# Patient Record
Sex: Female | Born: 1965
Health system: Southern US, Community
[De-identification: ages and names within clinical notes are randomized; demographics above are authoritative.]

## PROBLEM LIST (undated history)

## (undated) HISTORY — PX: SPLENECTOMY, PARTIAL: SHX787

---

## 2001-07-16 ENCOUNTER — Other Ambulatory Visit: Admission: RE | Admit: 2001-07-16 | Discharge: 2001-07-16 | Payer: Self-pay | Admitting: Gynecology

## 2005-04-14 HISTORY — PX: APPENDECTOMY: SHX54

## 2005-11-04 ENCOUNTER — Observation Stay (HOSPITAL_COMMUNITY): Admission: EM | Admit: 2005-11-04 | Discharge: 2005-11-05 | Payer: Self-pay | Admitting: Emergency Medicine

## 2005-11-21 ENCOUNTER — Inpatient Hospital Stay (HOSPITAL_COMMUNITY): Admission: EM | Admit: 2005-11-21 | Discharge: 2005-12-04 | Payer: Self-pay | Admitting: Emergency Medicine

## 2005-12-19 ENCOUNTER — Encounter: Admission: RE | Admit: 2005-12-19 | Discharge: 2005-12-19 | Payer: Self-pay | Admitting: Cardiothoracic Surgery

## 2008-04-06 IMAGING — CT CT ABCESS DRAINAGE
1 of 2 series · 9 of 12 positions shown, 15 images · non-contrast
Comparison: CT scan 11/21/05.

CLINICAL DATA: 39-year-old female, status post appendectomy with a right lower quadrant retroperitoneal abscess. 
RIGHT LOWER QUADRANT RETROPERITONEAL ABSCESS DRAINAGE:

[Series 4: carevisionct · axial · 1.48mm/px · z∈[-187,-155]mm · 9 of 11 slices shown, 15 images]
[im 2/11  soft-tissue]
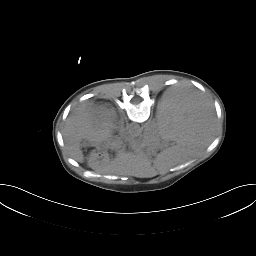
[im 2/11  bone]
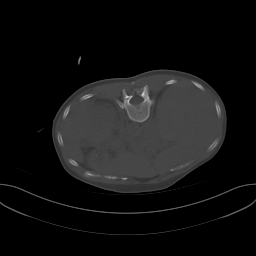
[im 3/11  soft-tissue]
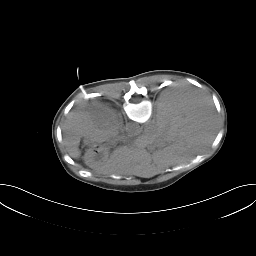
[im 4/11  soft-tissue]
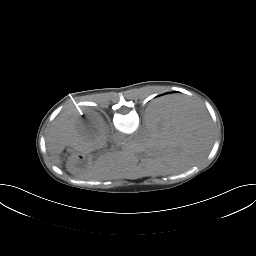
[im 5/11  soft-tissue]
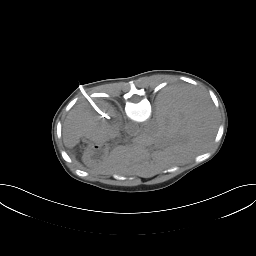
[im 6/11  soft-tissue]
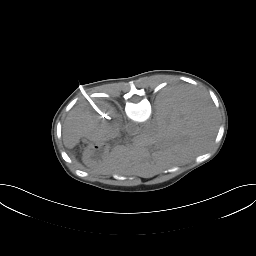
[im 7/11  soft-tissue]
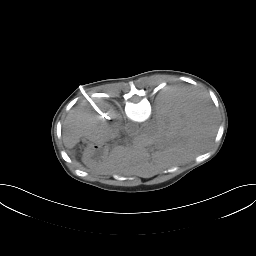
[im 7/11  lung]
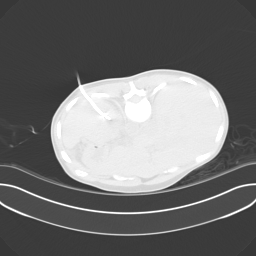
[im 8/11  soft-tissue]
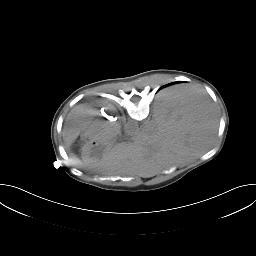
[im 8/11  lung]
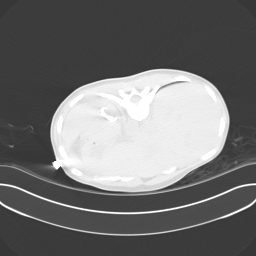
[im 9/11  soft-tissue]
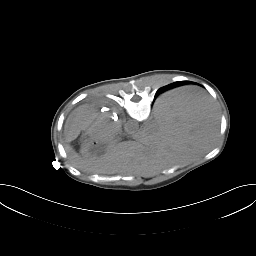
[im 9/11  lung]
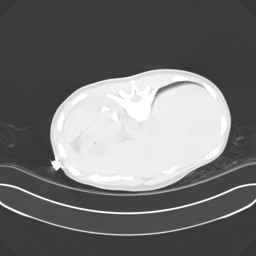
[im 10/11  soft-tissue]
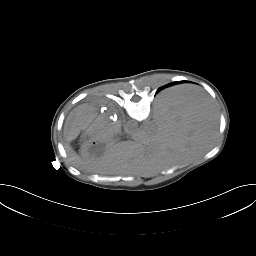
[im 10/11  lung]
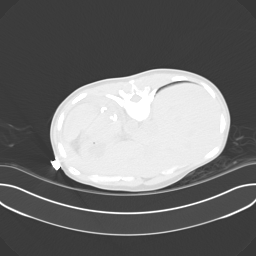
[im 10/11  bone]
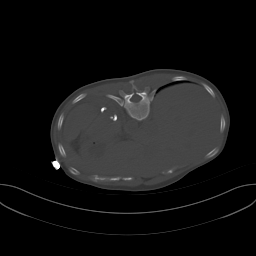

[9 of 12 positions shown; findings below may reference images not displayed]

Radiologist:  Nba Maiden, M.D.
Guidance:  Noncontrast CT.
Medications:  5 mg Versed, 150 mcg fentanyl. 
Conscious Sedation Time:  75 minutes for both procedures.
Procedure/Findings:  Informed consent was obtained.  The patient was positioned supine.  Noncontrast localization imaging was performed through the lower abdomen to demonstrate the right lower quadrant small abscess.  Under sterile conditions and local anesthesia, an 18 gauge introducer needle was advanced into the right lower quadrant abscess.  There was return of thick bloody purulent material.  An Amplatz guidewire was inserted followed by dilatation to insert a 10 French pigtail catheter.  Additional aspiration yielded a total volume of 6 cc of thick purulent material from the right lower quadrant.  The catheter was secured with a 0 Prolene suture and connected to a bulb suction drainage device.  A dry sterile dressing was applied.  The patient tolerated the procedure well.
IMPRESSION: 1.  Right lower quadrant retroperitoneal abscess drainage procedure of a small postoperative abscess related to recent appendicitis.
2.  6 cc of purulent material aspirated and sent for gram stain and culture.
CT-GUIDED DRAINAGE OF A LEFT UPPER QUADRANT SUBPHRENIC AND SUBCAPSULAR SPLENIC ABSCESS:
Radiologist:  Nba Maiden, M.D.
Guidance:  Noncontrast CT.
Medications:  5 mg Versed, 150 mcg fentanyl. 
Conscious Sedation Time:  75 minutes for both procedures.
Procedure/Findings:  The patient was repositioned prone.  Noncontrast localization imaging was performed through the upper abdomen to visualize the left upper quadrant subphrenic abscess.  Under sterile conditions and local anesthesia, an 18 gauge introducer needle was advanced intercostal into the subphrenic/subcapsular splenic abscess.  There was return of purulent material.  An Amplatz guidewire was inserted followed by dilatation to insert a 10 French drain.  Drainage position was confirmed within the cavity with CT imaging.  44 cc of purulent material was immediately aspirated.  The catheter was secured with a 0 Prolene suture and connected to a bulb suction device in a similar fashion.  A dry sterile dressing was applied.   The patient tolerated the procedure well.  There were no immediate complications.
IMPRESSION: CT-guided drainage of a left upper quadrant subphrenic and subcapsular splenic abscess with initial removal of 44 cc of purulent fluid.

## 2008-04-09 IMAGING — CR DG CHEST 1V
1 series · 1 of 1 positions shown · non-contrast
Comparison: none

CLINICAL DATA: Status post left thoracentesis.
 PORTABLE CHEST ? 1 VIEW ? 11/24/05 ? 5796 HOURS:

[view not recorded]
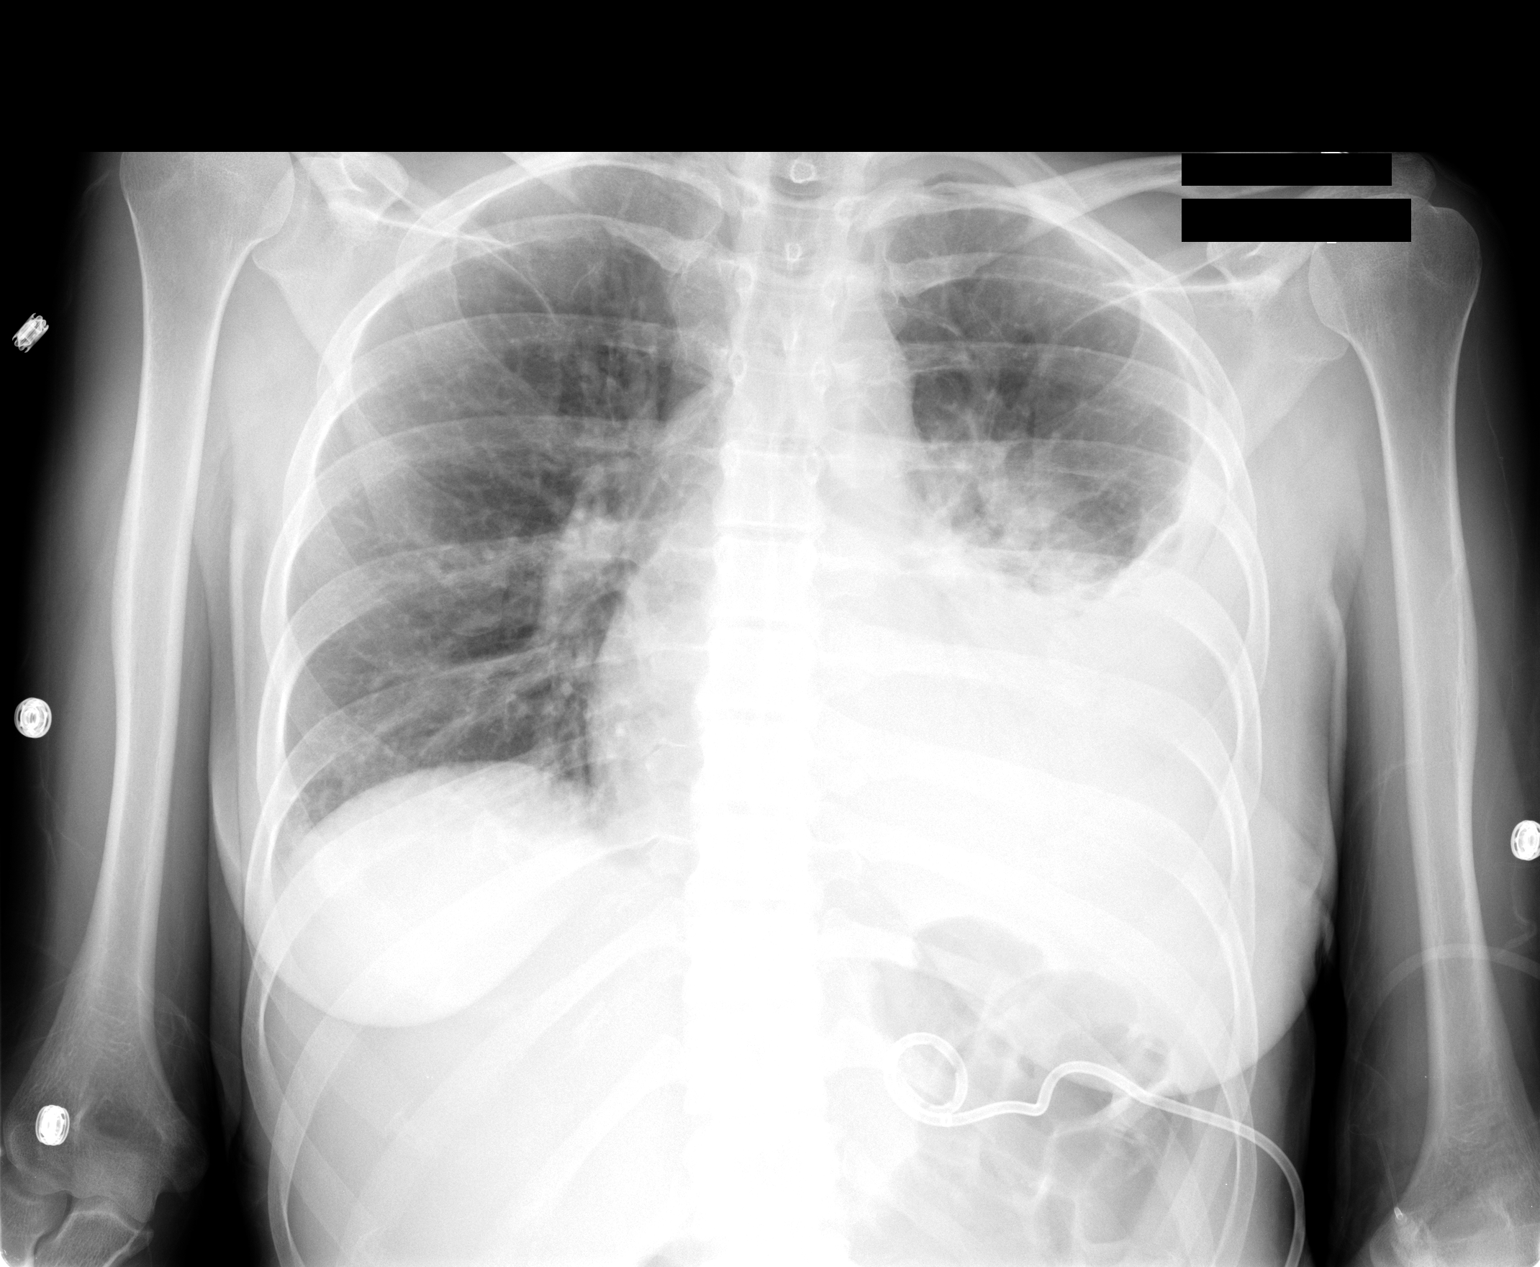

[1 of 1 positions shown; findings below may reference images not displayed]

FINDINGS: Left pleural fluid remains present as well as underlying consolidation and atelectasis of the left lower lung.  No pneumothorax after the procedure.  Drainage catheter present in the left upper abdomen.
IMPRESSION: Residual left pleural fluid with underlying atelectasis and consolidation of the left lower lung.  No pneumothorax after the procedure.

## 2010-05-05 ENCOUNTER — Encounter: Payer: Self-pay | Admitting: Cardiothoracic Surgery

## 2020-01-17 ENCOUNTER — Other Ambulatory Visit: Payer: Self-pay

## 2020-01-17 ENCOUNTER — Other Ambulatory Visit: Payer: Self-pay | Admitting: Nurse Practitioner

## 2020-01-17 ENCOUNTER — Encounter: Payer: Self-pay | Admitting: Nurse Practitioner

## 2020-01-17 ENCOUNTER — Ambulatory Visit (INDEPENDENT_AMBULATORY_CARE_PROVIDER_SITE_OTHER): Payer: BC Managed Care – PPO | Admitting: Nurse Practitioner

## 2020-01-17 VITALS — BP 122/80 | Ht 64.0 in | Wt 99.0 lb

## 2020-01-17 DIAGNOSIS — Z1231 Encounter for screening mammogram for malignant neoplasm of breast: Secondary | ICD-10-CM

## 2020-01-17 DIAGNOSIS — Z13220 Encounter for screening for lipoid disorders: Secondary | ICD-10-CM | POA: Diagnosis not present

## 2020-01-17 DIAGNOSIS — B009 Herpesviral infection, unspecified: Secondary | ICD-10-CM | POA: Diagnosis not present

## 2020-01-17 DIAGNOSIS — Z113 Encounter for screening for infections with a predominantly sexual mode of transmission: Secondary | ICD-10-CM

## 2020-01-17 DIAGNOSIS — Z01419 Encounter for gynecological examination (general) (routine) without abnormal findings: Secondary | ICD-10-CM

## 2020-01-17 DIAGNOSIS — Z23 Encounter for immunization: Secondary | ICD-10-CM | POA: Diagnosis not present

## 2020-01-17 DIAGNOSIS — R87612 Low grade squamous intraepithelial lesion on cytologic smear of cervix (LGSIL): Secondary | ICD-10-CM | POA: Diagnosis not present

## 2020-01-17 NOTE — Addendum Note (Signed)
Addended by: Tito Dine on: 01/17/2020 09:48 AM   Modules accepted: Orders

## 2020-01-17 NOTE — Progress Notes (Signed)
   Brittany Nixon [birth year] [id number]   History:  54 y.o. G2P2 presents as new patient to establish care. Normal pap history, last pap years ago per patient. History of irregular cycles, LMP in July. Denies menopausal symptoms but has had increased anxiety but may be due to personal stress.  She has also had some weight loss.  Has not had screening mammogram or colonoscopy. Would like STD testing today.  Gynecologic History Patient's last menstrual period was 11/05/2019. Period Pattern: (!) Irregular Menstrual Flow: Moderate Menstrual Control: Maxi pad, Tampon Dysmenorrhea: (!) Mild Dysmenorrhea Symptoms: Cramping Contraception: none Last Pap: UNK. Results were: normal Last mammogram: Never Last colonoscopy: Never  Past medical history, past surgical history, family history and social history were all reviewed and documented in the EPIC chart.  ROS:  A ROS was performed and pertinent positives and negatives are included.  Exam:  Vitals:   01/17/20 0832  BP: 122/80  Weight: 99 lb (44.9 kg)  Height: 5\' 4"  (1.626 m)   Body mass index is 16.99 kg/m.  General appearance:  Normal, underweight Thyroid:  Symmetrical, normal in size, without palpable masses or nodularity. Respiratory  Auscultation:  Clear without wheezing or rhonchi Cardiovascular  Auscultation:  Regular rate, without rubs, murmurs or gallops  Edema/varicosities:  Not grossly evident Abdominal  Soft,nontender, without masses, guarding or rebound.  Liver/spleen:  No organomegaly noted  Hernia:  None appreciated  Skin  Inspection:  Grossly normal   Breasts: Examined lying and sitting.   Right: Without masses, retractions, discharge or axillary adenopathy.   Left: Without masses, retractions, discharge or axillary adenopathy. Gentitourinary   Inguinal/mons:  Normal without inguinal adenopathy  External genitalia:  Normal  BUS/Urethra/Skene's glands:  Normal  Vagina:  Normal  Cervix:  Normal  Uterus:   Anteverted, normal in size, shape and contour.  Midline and mobile  Adnexa/parametria:     Rt: Without masses or tenderness.   Lt: Without masses or tenderness.  Anus and perineum: Normal   Assessment/Plan:  54 y.o. G2P2 for annual exam.   Well female exam with routine gynecological exam - Plan: CBC with Differential/Platelet, Comprehensive metabolic panel, Lipid panel, TSH, VITAMIN D 25 Hydroxy (Vit-D Deficiency, Fractures). Education provided on SBEs, importance of preventative screenings, current guidelines, high calcium diet, regular exercise, and multivitamin daily.   Screen for STD (sexually transmitted disease) - Plan: HIV Antibody (routine testing w rflx), RPR, C. trachomatis/N. gonorrhoeae RNA  HSV-1 infection - Plan: HSV(herpes simplex vrs) 1+2 ab-IgG - Has had a couple of sores on mouth.   Need for immunization against influenza - Plan: Flu Vaccine QUAD 36+ mos IM  Screening for cervical cancer - normal pap history.  Last Pap many years ago.  Pap with high risk HPV typing today.  Screening for breast cancer -has not had screening mammogram.  Discussed current guidelines and importance of preventative screenings.  Information provided on the breast center and she plans to call to schedule this soon.  Screening for colon cancer -has not had a screening colonoscopy.  Information provided on the bowel or GI.  Follow-up in 1 year for annual.      [facility], 8:49 AM 01/17/2020

## 2020-01-17 NOTE — Patient Instructions (Addendum)
Schedule mammogram! [facility] [phone] [address]  [place], [place] [address]  Schedule colonoscopy! [facility] [phone] [address] [place], [place] [address]  Health Maintenance, Female Adopting a healthy lifestyle and getting preventive care are important in promoting health and wellness. Ask your health care provider about:  The right schedule for you to have regular tests and exams.  Things you can do on your own to prevent diseases and keep yourself healthy. What should I know about diet, weight, and exercise? Eat a healthy diet   Eat a diet that includes plenty of vegetables, fruits, low-fat dairy products, and lean protein.  Do not eat a lot of foods that are high in solid fats, added sugars, or sodium. Maintain a healthy weight Body mass index (BMI) is used to identify weight problems. It estimates body fat based on height and weight. Your health care provider can help determine your BMI and help you achieve or maintain a healthy weight. Get regular exercise Get regular exercise. This is one of the most important things you can do for your health. Most adults should:  Exercise for at least 150 minutes each week. The exercise should increase your heart rate and make you sweat (moderate-intensity exercise).  Do strengthening exercises at least twice a week. This is in addition to the moderate-intensity exercise.  Spend less time sitting. Even light physical activity can be beneficial. Watch cholesterol and blood lipids Have your blood tested for lipids and cholesterol at 53 years of age, then have this test every 5 years. Have your cholesterol levels checked more often if:  Your lipid or cholesterol levels are high.  You are older than 53 years of age.  You are at high risk for heart disease. What should I know about cancer screening? Depending on your health history and family history, you may need to have cancer screening  at various ages. This may include screening for:  Breast cancer.  Cervical cancer.  Colorectal cancer.  Skin cancer.  Lung cancer. What should I know about heart disease, diabetes, and high blood pressure? Blood pressure and heart disease  High blood pressure causes heart disease and increases the risk of stroke. This is more likely to develop in people who have high blood pressure readings, are of African descent, or are overweight.  Have your blood pressure checked: ? Every 3-5 years if you are 18-39 years of age. ? Every year if you are 40 years old or older. Diabetes Have regular diabetes screenings. This checks your fasting blood sugar level. Have the screening done:  Once every three years after age 40 if you are at a normal weight and have a low risk for diabetes.  More often and at a younger age if you are overweight or have a high risk for diabetes. What should I know about preventing infection? Hepatitis B If you have a higher risk for hepatitis B, you should be screened for this virus. Talk with your health care provider to find out if you are at risk for hepatitis B infection. Hepatitis C Testing is recommended for:  Everyone born from 1945 through 1965.  Anyone with known risk factors for hepatitis C. Sexually transmitted infections (STIs)  Get screened for STIs, including gonorrhea and chlamydia, if: ? You are sexually active and are younger than 53 years of age. ? You are older than 53 years of age and your health care provider tells you that you are at   risk for this type of infection. ? Your sexual activity has changed since you were last screened, and you are at increased risk for chlamydia or gonorrhea. Ask your health care provider if you are at risk.  Ask your health care provider about whether you are at high risk for HIV. Your health care provider may recommend a prescription medicine to help prevent HIV infection. If you choose to take medicine to  prevent HIV, you should first get tested for HIV. You should then be tested every 3 months for as long as you are taking the medicine. Pregnancy  If you are about to stop having your period (premenopausal) and you may become pregnant, seek counseling before you get pregnant.  Take 400 to 800 micrograms (mcg) of folic acid every day if you become pregnant.  Ask for birth control (contraception) if you want to prevent pregnancy. Osteoporosis and menopause Osteoporosis is a disease in which the bones lose minerals and strength with aging. This can result in bone fractures. If you are 65 years old or older, or if you are at risk for osteoporosis and fractures, ask your health care provider if you should:  Be screened for bone loss.  Take a calcium or vitamin D supplement to lower your risk of fractures.  Be given hormone replacement therapy (HRT) to treat symptoms of menopause. Follow these instructions at home: Lifestyle  Do not use any products that contain nicotine or tobacco, such as cigarettes, e-cigarettes, and chewing tobacco. If you need help quitting, ask your health care provider.  Do not use street drugs.  Do not share needles.  Ask your health care provider for help if you need support or information about quitting drugs. Alcohol use  Do not drink alcohol if: ? Your health care provider tells you not to drink. ? You are pregnant, may be pregnant, or are planning to become pregnant.  If you drink alcohol: ? Limit how much you use to 0-1 drink a day. ? Limit intake if you are breastfeeding.  Be aware of how much alcohol is in your drink. In the [place] one drink equals one 12 oz bottle of beer (355 mL), one 5 oz glass of wine (148 mL), or one 1 oz glass of hard liquor (44 mL). General instructions  Schedule regular health, dental, and eye exams.  Stay current with your vaccines.  Tell your health care provider if: ? You often feel depressed. ? You have ever been  abused or do not feel safe at home. Summary  Adopting a healthy lifestyle and getting preventive care are important in promoting health and wellness.  Follow your health care provider's instructions about healthy diet, exercising, and getting tested or screened for diseases.  Follow your health care provider's instructions on monitoring your cholesterol and blood pressure. This information is not intended to replace advice given to you by your health care provider. Make sure you discuss any questions you have with your health care provider. Document Revised: 03/24/2018 Document Reviewed: 03/24/2018 Elsevier Patient Education  2020 Elsevier Inc.  

## 2020-01-18 LAB — LIPID PANEL
Cholesterol: 163 mg/dL (ref <200)
HDL: 74 mg/dL (ref >=50)
LDL Cholesterol (Calc): 71 mg/dL (calc)
Non-HDL Cholesterol (Calc): 89 mg/dL (calc) (ref <130)
Total CHOL/HDL Ratio: 2.2 (calc) (ref <5.0)
Triglycerides: 96 mg/dL (ref <150)

## 2020-01-18 LAB — CBC WITH DIFFERENTIAL/PLATELET
Absolute Monocytes: 319 cells/uL (ref 200–950)
Basophils Absolute: 42 cells/uL (ref 0–200)
Basophils Relative: 1 %
Eosinophils Absolute: 42 cells/uL (ref 15–500)
Eosinophils Relative: 1 %
HCT: 39 % (ref 35.0–45.0)
Hemoglobin: 12.8 g/dL (ref 11.7–15.5)
Lymphs Abs: 1117 cells/uL (ref 850–3900)
MCH: 30.6 pg (ref 27.0–33.0)
MCHC: 32.8 g/dL (ref 32.0–36.0)
MCV: 93.3 fL (ref 80.0–100.0)
MPV: 13.4 fL — ABNORMAL HIGH (ref 7.5–12.5)
Monocytes Relative: 7.6 %
Neutro Abs: 2680 cells/uL (ref 1500–7800)
Neutrophils Relative %: 63.8 %
Platelets: 247 10*3/uL (ref 140–400)
RBC: 4.18 10*6/uL (ref 3.80–5.10)
RDW: 12 % (ref 11.0–15.0)
Total Lymphocyte: 26.6 %
WBC: 4.2 10*3/uL (ref 3.8–10.8)

## 2020-01-18 LAB — COMPREHENSIVE METABOLIC PANEL
AG Ratio: 2.2 (calc) (ref 1.0–2.5)
ALT: 11 U/L (ref 6–29)
AST: 13 U/L (ref 10–35)
Albumin: 4.7 g/dL (ref 3.6–5.1)
Alkaline phosphatase (APISO): 35 U/L — ABNORMAL LOW (ref 37–153)
BUN/Creatinine Ratio: 9 (calc) (ref 6–22)
BUN: 6 mg/dL — ABNORMAL LOW (ref 7–25)
CO2: 27 mmol/L (ref 20–32)
Calcium: 9.9 mg/dL (ref 8.6–10.4)
Chloride: 103 mmol/L (ref 98–110)
Creat: 0.7 mg/dL (ref 0.50–1.05)
Globulin: 2.1 g/dL (calc) (ref 1.9–3.7)
Glucose, Bld: 92 mg/dL (ref 65–99)
Potassium: 4.1 mmol/L (ref 3.5–5.3)
Sodium: 140 mmol/L (ref 135–146)
Total Bilirubin: 0.8 mg/dL (ref 0.2–1.2)
Total Protein: 6.8 g/dL (ref 6.1–8.1)

## 2020-01-18 LAB — HSV(HERPES SIMPLEX VRS) I + II AB-IGG
HAV 1 IGG,TYPE SPECIFIC AB: 31.5 index — ABNORMAL HIGH
HSV 2 IGG,TYPE SPECIFIC AB: <0.9 index

## 2020-01-18 LAB — RPR: RPR Ser Ql: NONREACTIVE

## 2020-01-18 LAB — C. TRACHOMATIS/N. GONORRHOEAE RNA
C. trachomatis RNA, TMA: NOT DETECTED
N. gonorrhoeae RNA, TMA: NOT DETECTED

## 2020-01-18 LAB — TSH: TSH: 1.21 mIU/L

## 2020-01-18 LAB — VITAMIN D 25 HYDROXY (VIT D DEFICIENCY, FRACTURES): Vit D, 25-Hydroxy: 54 ng/mL (ref 30–100)

## 2020-01-18 LAB — HIV ANTIBODY (ROUTINE TESTING W REFLEX): HIV 1&2 Ab, 4th Generation: NONREACTIVE

## 2020-01-20 ENCOUNTER — Other Ambulatory Visit: Payer: Self-pay

## 2020-01-20 ENCOUNTER — Ambulatory Visit
Admission: RE | Admit: 2020-01-20 | Discharge: 2020-01-20 | Disposition: A | Discharge status: Home / Self Care | Payer: Self-pay | Source: Ambulatory Visit | Attending: Nurse Practitioner | Admitting: Nurse Practitioner

## 2020-01-20 DIAGNOSIS — Z1231 Encounter for screening mammogram for malignant neoplasm of breast: Secondary | ICD-10-CM

## 2020-01-20 LAB — PAP IG W/ RFLX HPV ASCU

## 2020-01-23 NOTE — Telephone Encounter (Signed)
Spoke with patient and informed her about results. Colposcopy scheduled.

## 2020-02-02 ENCOUNTER — Other Ambulatory Visit: Payer: Self-pay

## 2020-02-02 ENCOUNTER — Ambulatory Visit: Payer: BC Managed Care – PPO | Admitting: Obstetrics and Gynecology

## 2020-02-02 ENCOUNTER — Encounter: Payer: Self-pay | Admitting: Obstetrics and Gynecology

## 2020-02-02 VITALS — BP 118/76

## 2020-02-02 DIAGNOSIS — R87612 Low grade squamous intraepithelial lesion on cytologic smear of cervix (LGSIL): Secondary | ICD-10-CM | POA: Diagnosis not present

## 2020-02-02 NOTE — Progress Notes (Signed)
   Brittany Nixon Oct 31, 1965 [id number]  SUBJECTIVE:  54 y.o. G2P2 female presents for a colposcopy exam due to an abnormal Pap smear.  01/17/2020 she had a Pap smear with LGSIL cytology.  She reports she has never had an abnormal Pap smear in the past.  No current outpatient medications on file.   No current facility-administered medications for this visit.   Allergies: Patient has no known allergies.  Patient's last menstrual period was 01/20/2020.   OBJECTIVE:  BP 118/76   LMP 01/20/2020   PELVIC EXAM: VULVA: normal appearing vulva with no masses, tenderness or lesions, VAGINA: normal appearing vagina with normal color and discharge, no lesions, CERVIX: normal appearing cervix without discharge or lesions  Physical Exam Genitourinary:        Colposcopy procedure note The cervix was viewed with a speculum.  A dilute acetic acid cleanse was performed.  Colposcopic examination revealed scattered punctation circumferentially around the external os and concentrated at the posterior portion, additionally, there was a line of acetowhite change from 4-7:00 at the transformation zone.  Transformation zone was visualized in its entirety so today's exam is adequate.  Visual impression consistent with CIN-1.  Tischler biopsy forceps was used to biopsy at 5:00 at the transformation zone.  Hemostasis was obtained with application of pressure and silver nitrate cautery.  The patient tolerated the procedure well.  Chaperone: Kennon Portela present during the examination and procedure  ASSESSMENT:  54 y.o. G2P2 here for a colposcopy exam  PLAN:  Colposcopy exam today shows changes consistent with CIN-1.  We discussed the role of HPV in mediating dysplastic changes of the cervix.  We will let the patient know when we get her biopsy results back and the plan moving forward depending on the results of the biopsy.   Brittany Majors MD 02/02/20

## 2020-02-06 ENCOUNTER — Ambulatory Visit: Payer: BC Managed Care – PPO | Admitting: Obstetrics and Gynecology

## 2020-02-06 LAB — PATHOLOGY REPORT

## 2020-02-06 LAB — TISSUE SPECIMEN

## 2020-02-07 ENCOUNTER — Ambulatory Visit: Payer: Self-pay | Admitting: Nurse Practitioner

## 2020-02-22 ENCOUNTER — Ambulatory Visit: Payer: Self-pay

## 2020-02-22 ENCOUNTER — Ambulatory Visit: Payer: Self-pay | Admitting: Nurse Practitioner

## 2020-03-21 DIAGNOSIS — L308 Other specified dermatitis: Secondary | ICD-10-CM | POA: Diagnosis not present

## 2020-03-21 DIAGNOSIS — L858 Other specified epidermal thickening: Secondary | ICD-10-CM | POA: Diagnosis not present

## 2020-12-19 ENCOUNTER — Encounter: Payer: Self-pay | Admitting: Nurse Practitioner

## 2020-12-24 ENCOUNTER — Other Ambulatory Visit: Payer: Self-pay | Admitting: Nurse Practitioner

## 2020-12-24 DIAGNOSIS — Z1231 Encounter for screening mammogram for malignant neoplasm of breast: Secondary | ICD-10-CM

## 2021-01-01 ENCOUNTER — Other Ambulatory Visit: Payer: Self-pay

## 2021-01-01 ENCOUNTER — Ambulatory Visit (INDEPENDENT_AMBULATORY_CARE_PROVIDER_SITE_OTHER): Payer: BC Managed Care – PPO | Admitting: Nurse Practitioner

## 2021-01-01 ENCOUNTER — Other Ambulatory Visit (HOSPITAL_COMMUNITY)
Admission: RE | Admit: 2021-01-01 | Discharge: 2021-01-01 | Disposition: A | Discharge status: Home / Self Care | Payer: BC Managed Care – PPO | Source: Ambulatory Visit | Attending: Nurse Practitioner | Admitting: Nurse Practitioner

## 2021-01-01 ENCOUNTER — Encounter: Payer: Self-pay | Admitting: Nurse Practitioner

## 2021-01-01 VITALS — BP 118/74 | Ht 63.5 in | Wt 98.0 lb

## 2021-01-01 DIAGNOSIS — Z23 Encounter for immunization: Secondary | ICD-10-CM | POA: Diagnosis not present

## 2021-01-01 DIAGNOSIS — N951 Menopausal and female climacteric states: Secondary | ICD-10-CM | POA: Diagnosis not present

## 2021-01-01 DIAGNOSIS — Z01419 Encounter for gynecological examination (general) (routine) without abnormal findings: Secondary | ICD-10-CM | POA: Diagnosis not present

## 2021-01-01 DIAGNOSIS — Z113 Encounter for screening for infections with a predominantly sexual mode of transmission: Secondary | ICD-10-CM

## 2021-01-01 DIAGNOSIS — Z118 Encounter for screening for other infectious and parasitic diseases: Secondary | ICD-10-CM | POA: Diagnosis not present

## 2021-01-01 NOTE — Patient Instructions (Signed)
[facility] [phone] [address] [place], [place] [address]  

## 2021-01-01 NOTE — Progress Notes (Signed)
   Brittany Nixon Dec 02, 1965 [id number]   History:  55 y.o. G2P2 presents for annual exam. Having irregular cycles occurring every 3-6 months. Denies menopausal symptoms. Pap 01/2020 showed LGSIL, biopsy confirmed mild atypia. Would like STD screening today. Up to date on Tdap and Shingrex - will send [place] dates. Would like Flu shot.   Gynecologic History Patient's last menstrual period was 10/28/2020. Period Duration (Days): 7 Period Pattern: (!) Irregular Menstrual Flow: Moderate Dysmenorrhea: None Contraception: none Sexually active: Yes  Health Maintenance Last Pap: 01/17/2020. Results were: LGSIL, biopsy - mild atypia Last mammogram: 01/20/2020. Results were: Normal Last colonoscopy: Never Last Dexa: Not indicated  Past medical history, past surgical history, family history and social history were all reviewed and documented in the EPIC chart. Married. 2 daughters - Brittany Nixon 42 in [employer], Brittany Nixon 27. 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   ROS:  A ROS was performed and pertinent positives and negatives are included.  Exam:  Vitals:   01/01/21 0911  BP: 118/74  Weight: 98 lb (44.5 kg)  Height: 5' 3.5" (1.613 m)    Body mass index is 17.09 kg/m.  General appearance:  Normal, underweight Thyroid:  Symmetrical, normal in size, without palpable masses or nodularity. Respiratory  Auscultation:  Clear without wheezing or rhonchi Cardiovascular  Auscultation:  Regular rate, without rubs, murmurs or gallops  Edema/varicosities:  Not grossly evident Abdominal  Soft,nontender, without masses, guarding or rebound.  Liver/spleen:  No organomegaly noted  Hernia:  None appreciated  Skin  Inspection:  Grossly normal   Breasts: Examined lying and sitting.   Right: Without masses, retractions, discharge or axillary adenopathy.   Left: Without masses, retractions, discharge or axillary adenopathy. Gentitourinary   Inguinal/mons:  Normal without inguinal adenopathy  External  genitalia:  Normal  BUS/Urethra/Skene's glands:  Normal  Vagina:  Normal  Cervix:  Normal  Uterus:  Normal in size, shape and contour.  Midline and mobile  Adnexa/parametria:     Rt: Without masses or tenderness.   Lt: Without masses or tenderness.  Anus and perineum: Normal Digital rectal exam: Normal sphincter tone without palpated masses or tenderness  Patient informed chaperone available to be present for breast and pelvic exam. Patient has requested no chaperone to be present. Patient has been advised what will be completed during breast and pelvic exam.    Assessment/Plan:  55 y.o. G2P2 for annual exam.   Well female exam with routine gynecological exam - Plan: Cytology - PAP( [place]), CBC with Differential/Platelet, Comprehensive metabolic panel. Education provided on SBEs, importance of preventative screenings, current guidelines, high calcium diet, regular exercise, and multivitamin daily.   Screen for STD (sexually transmitted disease) - Plan: RPR, HIV Antibody (routine testing w rflx), Hepatitis C antibody. GC/chlamydia/trich added to pap.   Perimenopausal - has started having irregular cycles ranging from 3-6 months. Denies menopausal symptoms.   Need for immunization against influenza - Plan: Flu Vaccine QUAD 72mo+IM (Fluarix, Fluzone & Alfiuria Quad PF)  Screening for cervical cancer - 01/2020 showed LGSIL, biopsy showed mild atypia. Pap today.   Screening for breast cancer - Normal mammogram history.  Continue annual screenings.  Normal breast exam today.  Screening for colon cancer - Has not had screening colonoscopy. Discussed current guidelines and importance of preventative screenings. Information provided on [facility].    Follow-up in 1 year for annual.      Brittany Nixon [facility], 10:01 AM 01/01/2021

## 2021-01-02 ENCOUNTER — Encounter: Payer: Self-pay | Admitting: Nurse Practitioner

## 2021-01-02 LAB — CBC WITH DIFFERENTIAL/PLATELET
Absolute Monocytes: 401 cells/uL (ref 200–950)
Basophils Absolute: 27 cells/uL (ref 0–200)
Basophils Relative: 0.4 %
Eosinophils Absolute: 48 cells/uL (ref 15–500)
Eosinophils Relative: 0.7 %
HCT: 37.4 % (ref 35.0–45.0)
Hemoglobin: 11.8 g/dL (ref 11.7–15.5)
Lymphs Abs: 1122 cells/uL (ref 850–3900)
MCH: 29.9 pg (ref 27.0–33.0)
MCHC: 31.6 g/dL — ABNORMAL LOW (ref 32.0–36.0)
MCV: 94.7 fL (ref 80.0–100.0)
MPV: 12.7 fL — ABNORMAL HIGH (ref 7.5–12.5)
Monocytes Relative: 5.9 %
Neutro Abs: 5202 cells/uL (ref 1500–7800)
Neutrophils Relative %: 76.5 %
Platelets: 206 10*3/uL (ref 140–400)
RBC: 3.95 10*6/uL (ref 3.80–5.10)
RDW: 11.8 % (ref 11.0–15.0)
Total Lymphocyte: 16.5 %
WBC: 6.8 10*3/uL (ref 3.8–10.8)

## 2021-01-02 LAB — COMPREHENSIVE METABOLIC PANEL
AG Ratio: 2 (calc) (ref 1.0–2.5)
ALT: 7 U/L (ref 6–29)
AST: 13 U/L (ref 10–35)
Albumin: 4.3 g/dL (ref 3.6–5.1)
Alkaline phosphatase (APISO): 38 U/L (ref 37–153)
BUN/Creatinine Ratio: 9 (calc) (ref 6–22)
BUN: 6 mg/dL — ABNORMAL LOW (ref 7–25)
CO2: 27 mmol/L (ref 20–32)
Calcium: 9.4 mg/dL (ref 8.6–10.4)
Chloride: 101 mmol/L (ref 98–110)
Creat: 0.67 mg/dL (ref 0.50–1.03)
Globulin: 2.2 g/dL (calc) (ref 1.9–3.7)
Glucose, Bld: 83 mg/dL (ref 65–99)
Potassium: 4.8 mmol/L (ref 3.5–5.3)
Sodium: 139 mmol/L (ref 135–146)
Total Bilirubin: 0.6 mg/dL (ref 0.2–1.2)
Total Protein: 6.5 g/dL (ref 6.1–8.1)

## 2021-01-02 LAB — RPR: RPR Ser Ql: NONREACTIVE

## 2021-01-02 LAB — HEPATITIS C ANTIBODY
Hepatitis C Ab: NONREACTIVE
SIGNAL TO CUT-OFF: 0.01 (ref <1.00)

## 2021-01-02 LAB — HIV ANTIBODY (ROUTINE TESTING W REFLEX): HIV 1&2 Ab, 4th Generation: NONREACTIVE

## 2021-01-03 ENCOUNTER — Ambulatory Visit
Admission: RE | Admit: 2021-01-03 | Discharge: 2021-01-03 | Disposition: A | Discharge status: Home / Self Care | Payer: BC Managed Care – PPO | Source: Ambulatory Visit

## 2021-01-03 ENCOUNTER — Other Ambulatory Visit: Payer: Self-pay

## 2021-01-03 DIAGNOSIS — Z1231 Encounter for screening mammogram for malignant neoplasm of breast: Secondary | ICD-10-CM

## 2021-01-08 ENCOUNTER — Encounter: Payer: Self-pay | Admitting: Nurse Practitioner

## 2021-01-09 LAB — CYTOLOGY - PAP
Chlamydia: NEGATIVE
Comment: NEGATIVE
Comment: NEGATIVE
Comment: NEGATIVE
Comment: NORMAL
Diagnosis: NEGATIVE
Diagnosis: REACTIVE
High risk HPV: NEGATIVE
Neisseria Gonorrhea: NEGATIVE
Trichomonas: NEGATIVE

## 2021-01-10 ENCOUNTER — Encounter: Payer: Self-pay | Admitting: Nurse Practitioner
